# Patient Record
Sex: Male | Born: 1950 | Race: White | Hispanic: No | Marital: Married | Smoking: Never smoker
Health system: Southern US, Community
[De-identification: ages and names within clinical notes are randomized; demographics above are authoritative.]

## PROBLEM LIST (undated history)

## (undated) DIAGNOSIS — I10 Essential (primary) hypertension: Secondary | ICD-10-CM

## (undated) DIAGNOSIS — C801 Malignant (primary) neoplasm, unspecified: Secondary | ICD-10-CM

## (undated) DIAGNOSIS — K219 Gastro-esophageal reflux disease without esophagitis: Secondary | ICD-10-CM

## (undated) DIAGNOSIS — D649 Anemia, unspecified: Secondary | ICD-10-CM

## (undated) HISTORY — PX: APPENDECTOMY: SHX54

## (undated) HISTORY — PX: EYE SURGERY: SHX253

---

## 2017-08-28 ENCOUNTER — Other Ambulatory Visit (HOSPITAL_COMMUNITY): Payer: Self-pay | Admitting: Orthopedic Surgery

## 2017-08-28 DIAGNOSIS — C61 Malignant neoplasm of prostate: Secondary | ICD-10-CM

## 2017-08-28 DIAGNOSIS — R972 Elevated prostate specific antigen [PSA]: Secondary | ICD-10-CM

## 2017-08-30 ENCOUNTER — Other Ambulatory Visit (HOSPITAL_COMMUNITY): Payer: Self-pay | Admitting: Family Medicine

## 2017-08-30 DIAGNOSIS — M545 Low back pain, unspecified: Secondary | ICD-10-CM

## 2017-09-06 ENCOUNTER — Ambulatory Visit (HOSPITAL_COMMUNITY)
Admission: RE | Admit: 2017-09-06 | Discharge: 2017-09-06 | Disposition: A | Discharge status: Home / Self Care | Payer: BLUE CROSS/BLUE SHIELD | Source: Ambulatory Visit | Attending: Orthopedic Surgery | Admitting: Orthopedic Surgery

## 2017-09-06 ENCOUNTER — Ambulatory Visit (HOSPITAL_COMMUNITY)
Admission: RE | Admit: 2017-09-06 | Discharge: 2017-09-06 | Disposition: A | Discharge status: Home / Self Care | Payer: BLUE CROSS/BLUE SHIELD | Source: Ambulatory Visit | Attending: Family Medicine | Admitting: Family Medicine

## 2017-09-06 DIAGNOSIS — R590 Localized enlarged lymph nodes: Secondary | ICD-10-CM | POA: Diagnosis not present

## 2017-09-06 DIAGNOSIS — Z8546 Personal history of malignant neoplasm of prostate: Secondary | ICD-10-CM | POA: Insufficient documentation

## 2017-09-06 DIAGNOSIS — M545 Low back pain, unspecified: Secondary | ICD-10-CM

## 2017-09-06 DIAGNOSIS — R972 Elevated prostate specific antigen [PSA]: Secondary | ICD-10-CM | POA: Insufficient documentation

## 2017-09-06 DIAGNOSIS — C61 Malignant neoplasm of prostate: Secondary | ICD-10-CM

## 2017-09-06 LAB — POCT I-STAT CREATININE: CREATININE: 1 mg/dL (ref 0.61–1.24)

## 2017-09-06 MED ORDER — GADOBENATE DIMEGLUMINE 529 MG/ML IV SOLN
20.0000 mL | Freq: Once | INTRAVENOUS | Status: AC | PRN
  Administered 2017-09-06: 16 mL via INTRAVENOUS

## 2017-09-10 ENCOUNTER — Other Ambulatory Visit: Payer: Self-pay | Admitting: Urology

## 2017-09-10 DIAGNOSIS — R972 Elevated prostate specific antigen [PSA]: Secondary | ICD-10-CM

## 2017-09-10 DIAGNOSIS — C61 Malignant neoplasm of prostate: Secondary | ICD-10-CM

## 2017-09-19 ENCOUNTER — Ambulatory Visit
Admission: RE | Admit: 2017-09-19 | Discharge: 2017-09-19 | Disposition: A | Discharge status: Home / Self Care | Payer: BLUE CROSS/BLUE SHIELD | Source: Ambulatory Visit | Attending: Urology | Admitting: Urology

## 2017-09-19 DIAGNOSIS — C61 Malignant neoplasm of prostate: Secondary | ICD-10-CM | POA: Diagnosis not present

## 2017-09-19 DIAGNOSIS — R972 Elevated prostate specific antigen [PSA]: Secondary | ICD-10-CM

## 2017-09-19 MED ORDER — FLUDEOXYGLUCOSE F - 18 (FDG) INJECTION
10.0000 | Freq: Once | INTRAVENOUS | Status: AC | PRN
  Administered 2017-09-19: 11.48 via INTRAVENOUS

## 2018-06-26 ENCOUNTER — Other Ambulatory Visit: Payer: Self-pay | Admitting: Urology

## 2018-06-26 DIAGNOSIS — C61 Malignant neoplasm of prostate: Secondary | ICD-10-CM

## 2018-06-26 DIAGNOSIS — R972 Elevated prostate specific antigen [PSA]: Secondary | ICD-10-CM

## 2018-07-11 ENCOUNTER — Encounter
Admission: RE | Admit: 2018-07-11 | Discharge: 2018-07-11 | Disposition: A | Discharge status: Home / Self Care | Payer: Medicare Other | Source: Ambulatory Visit | Attending: Urology | Admitting: Urology

## 2018-07-11 ENCOUNTER — Other Ambulatory Visit: Payer: Self-pay

## 2018-07-11 DIAGNOSIS — C61 Malignant neoplasm of prostate: Secondary | ICD-10-CM | POA: Insufficient documentation

## 2018-07-11 DIAGNOSIS — R972 Elevated prostate specific antigen [PSA]: Secondary | ICD-10-CM | POA: Diagnosis present

## 2018-07-11 MED ORDER — AXUMIN (FLUCICLOVINE F 18) INJECTION
10.1200 | Freq: Once | INTRAVENOUS | Status: AC | PRN
  Administered 2018-07-11: 10.12 via INTRAVENOUS

## 2019-06-24 ENCOUNTER — Other Ambulatory Visit: Payer: Self-pay | Admitting: Urology

## 2019-06-24 DIAGNOSIS — R972 Elevated prostate specific antigen [PSA]: Secondary | ICD-10-CM

## 2019-06-24 DIAGNOSIS — C61 Malignant neoplasm of prostate: Secondary | ICD-10-CM

## 2019-07-02 ENCOUNTER — Other Ambulatory Visit: Payer: Self-pay

## 2019-07-02 ENCOUNTER — Ambulatory Visit
Admission: RE | Admit: 2019-07-02 | Discharge: 2019-07-02 | Disposition: A | Discharge status: Home / Self Care | Payer: Medicare Other | Source: Ambulatory Visit | Attending: Urology | Admitting: Urology

## 2019-07-02 ENCOUNTER — Other Ambulatory Visit: Payer: Medicare Other

## 2019-07-02 DIAGNOSIS — C61 Malignant neoplasm of prostate: Secondary | ICD-10-CM

## 2019-07-02 DIAGNOSIS — K760 Fatty (change of) liver, not elsewhere classified: Secondary | ICD-10-CM | POA: Insufficient documentation

## 2019-07-02 DIAGNOSIS — K8020 Calculus of gallbladder without cholecystitis without obstruction: Secondary | ICD-10-CM | POA: Insufficient documentation

## 2019-07-02 DIAGNOSIS — R9721 Rising PSA following treatment for malignant neoplasm of prostate: Secondary | ICD-10-CM | POA: Insufficient documentation

## 2019-07-02 DIAGNOSIS — K5730 Diverticulosis of large intestine without perforation or abscess without bleeding: Secondary | ICD-10-CM | POA: Diagnosis not present

## 2019-07-02 DIAGNOSIS — R972 Elevated prostate specific antigen [PSA]: Secondary | ICD-10-CM

## 2019-07-02 DIAGNOSIS — I700 Atherosclerosis of aorta: Secondary | ICD-10-CM | POA: Insufficient documentation

## 2019-07-02 MED ORDER — AXUMIN (FLUCICLOVINE F 18) INJECTION
10.9600 | Freq: Once | INTRAVENOUS | Status: AC | PRN
  Administered 2019-07-02: 10.96 via INTRAVENOUS

## 2019-08-23 IMAGING — CT CT L SPINE W/O CM
3 series · 11 of 33 positions shown, 13 images · non-contrast
Comparison: None.

CLINICAL DATA: Low back pain for 3 weeks.

EXAM:
CT LUMBAR SPINE WITHOUT CONTRAST
TECHNIQUE: Multidetector CT imaging of the lumbar spine was performed without
intravenous contrast administration. Multiplanar CT image
reconstructions were also generated.

[Series 5: l spine soft · axial · 0.31mm/px · z∈[+1266,+1452]mm · 3 of 152 slices shown, 4 images]
[im 35/152  soft-tissue]
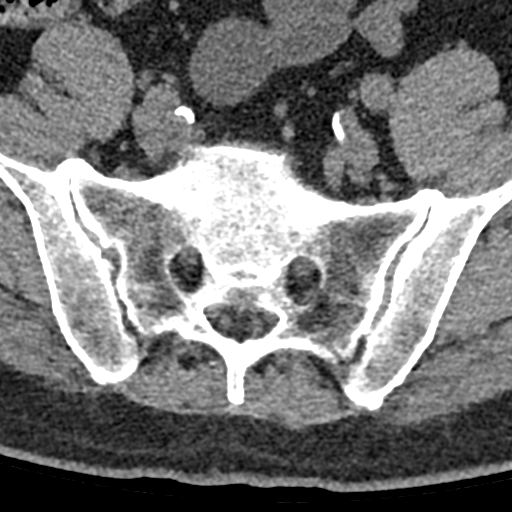
[im 35/152  bone]
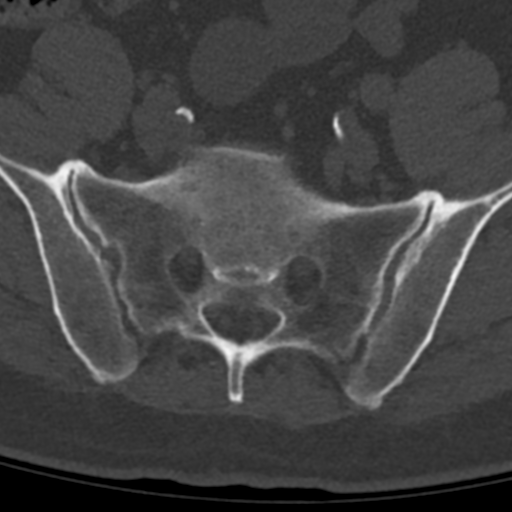
[im 82/152  bone]
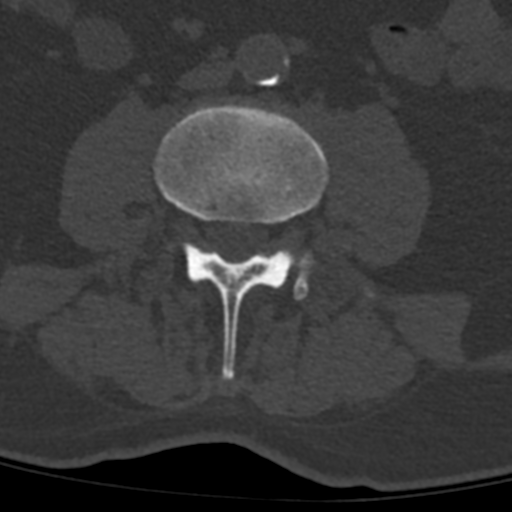
[im 128/152  bone]
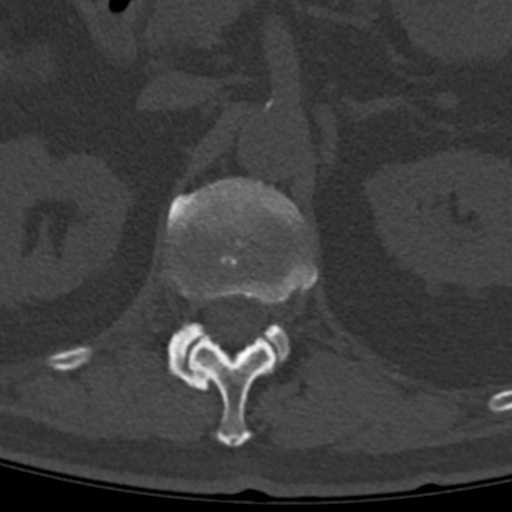

[Series 7: sagittal bone · sagittal · 0.31mm/px · 5 of 69 slices shown, 6 images]
[im 23/69  bone]
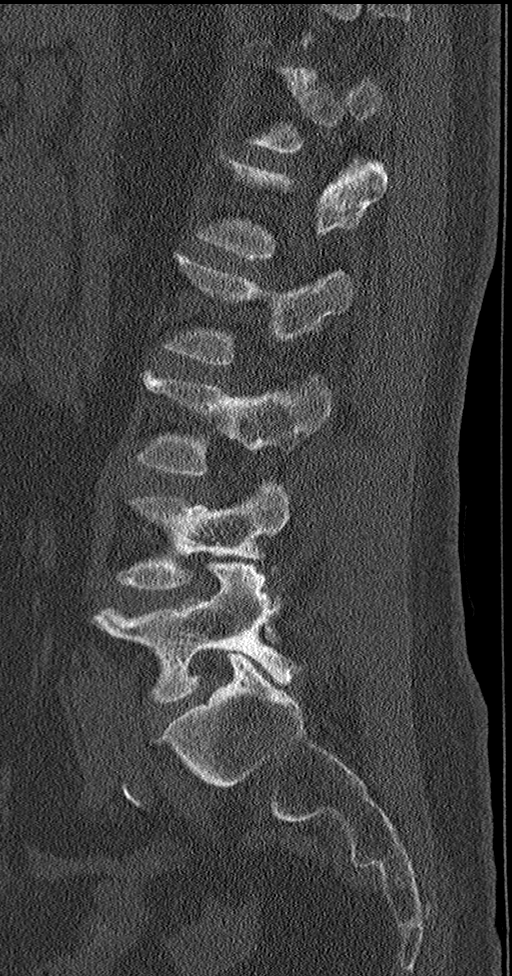
[im 29/69  bone]
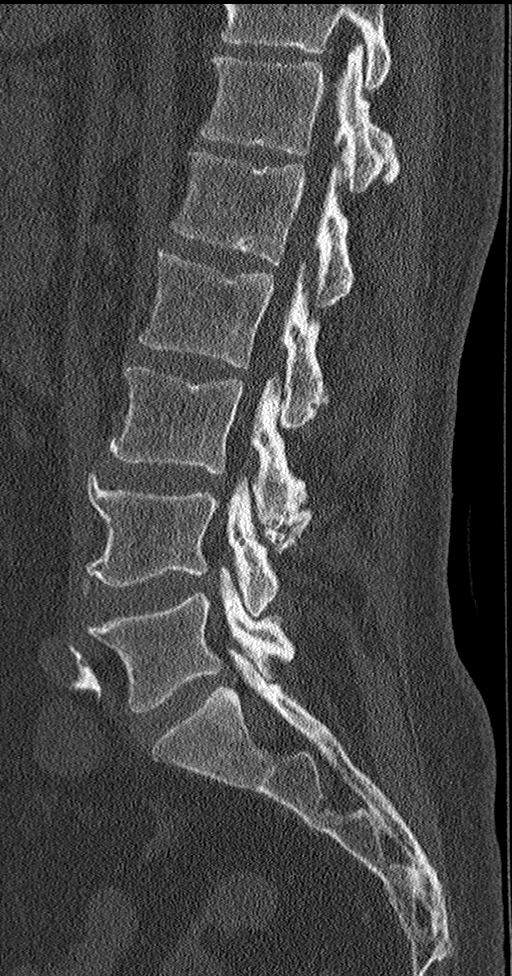
[im 35/69  soft-tissue]
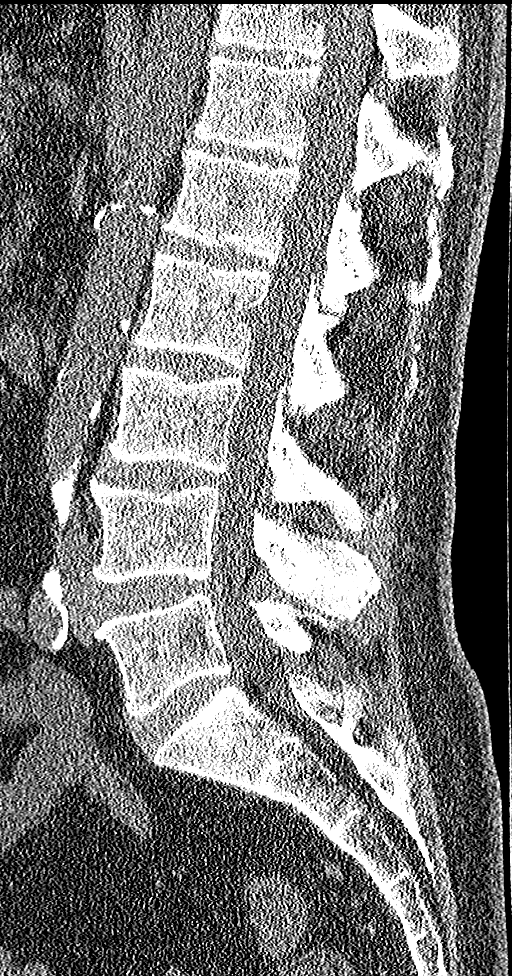
[im 35/69  bone]
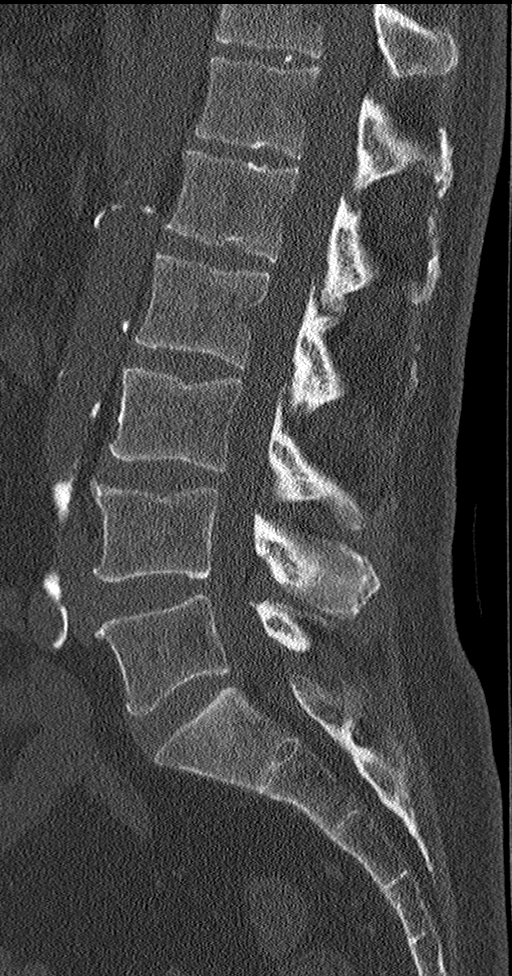
[im 40/69  bone]
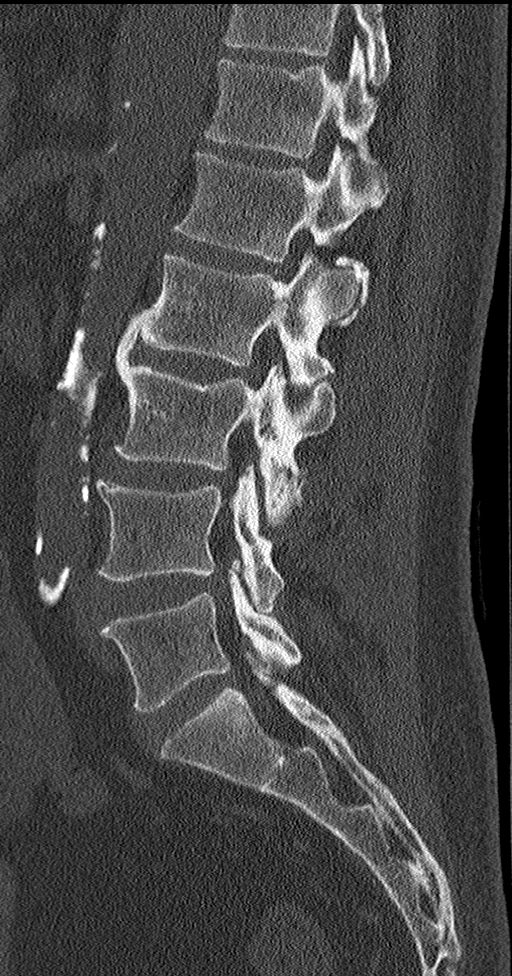
[im 46/69  bone]
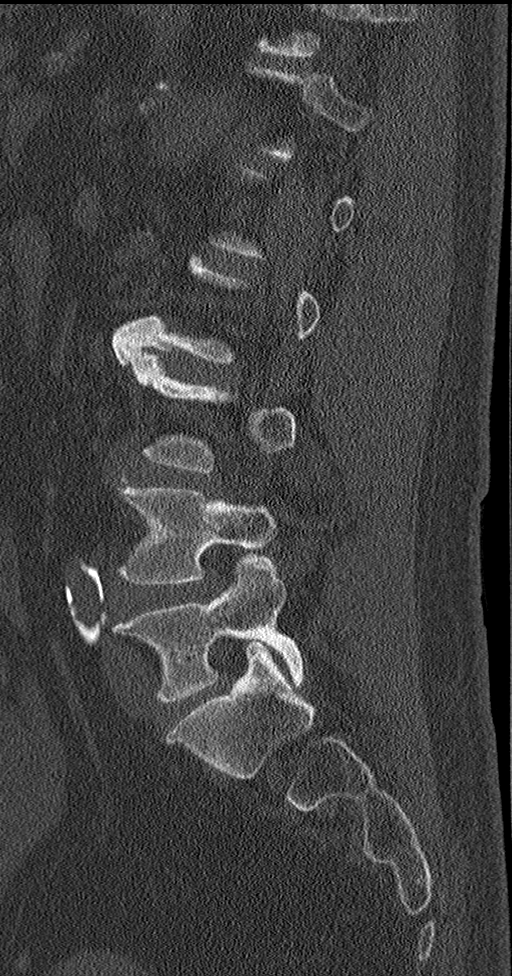

[Series 8: coronal bone · coronal · 0.44mm/px · 3 of 74 slices shown]
[im 15/74  bone]
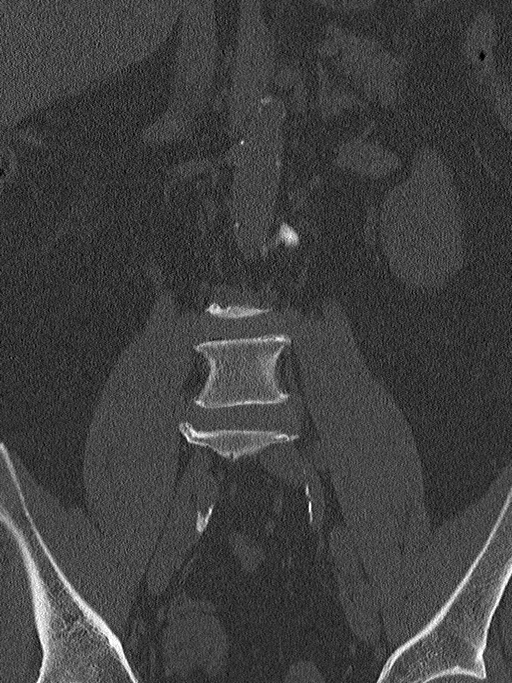
[im 30/74  bone]
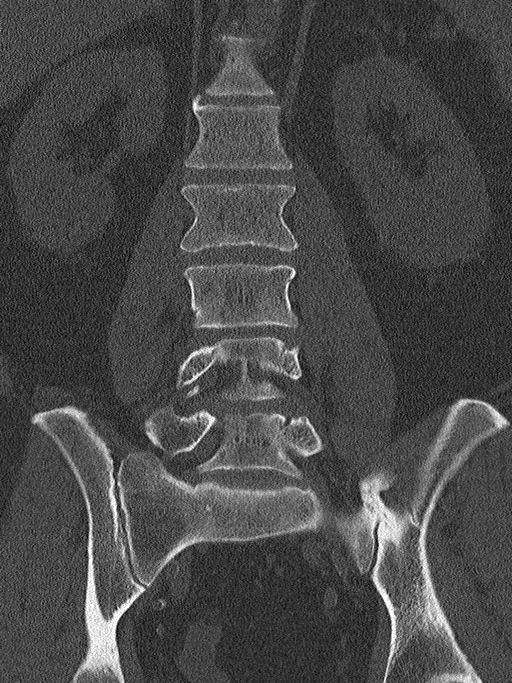
[im 44/74  bone]
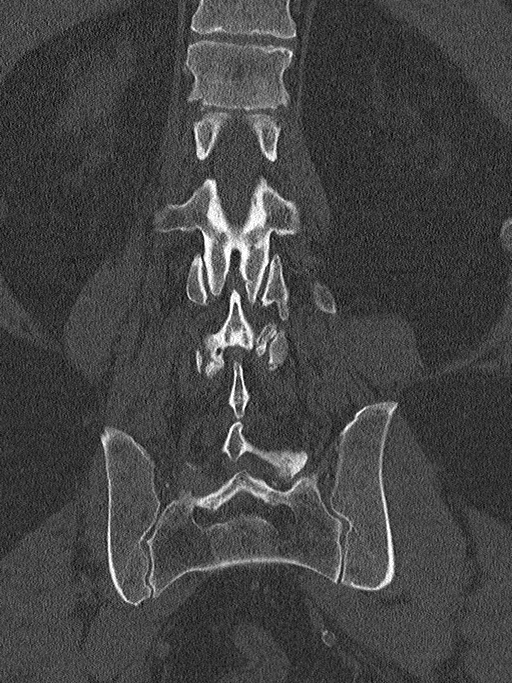

[11 of 33 positions shown; findings below may reference images not displayed]

FINDINGS: Segmentation: 5 lumbar type vertebrae.

Alignment: Normal.

Vertebrae: No acute fracture or focal pathologic process.

Paraspinal and other soft tissues: No acute paraspinal abnormality.

Disc levels: Disc spaces are maintained.

T12-L1: No significant disc protrusion. Moderate right and mild left
facet arthropathy.

L1-2: No disc protrusion. Mild bilateral facet arthropathy. No
foraminal stenosis.

L2-3: No disc protrusion. Moderate bilateral facet arthropathy. No
foraminal stenosis.

L3-4: Mild broad-based disc bulge. Moderate bilateral facet
arthropathy. Mild spinal stenosis. No foraminal stenosis.

L4-5: Broad-based disc bulge. Moderate bilateral facet arthropathy.
Moderate spinal stenosis. Mild bilateral foraminal stenosis.

L5-S1: Mild broad-based disc bulge. Moderate bilateral facet
arthropathy.
IMPRESSION: 1. No aggressive osseous lesion to suggest metastatic disease.
2. Mild lumbar spine spondylosis as described above.
3.  No acute osseous injury of the lumbar spine.

## 2021-01-10 ENCOUNTER — Other Ambulatory Visit: Payer: Self-pay | Admitting: Urology

## 2021-01-10 DIAGNOSIS — R972 Elevated prostate specific antigen [PSA]: Secondary | ICD-10-CM

## 2021-01-20 ENCOUNTER — Ambulatory Visit
Admission: RE | Admit: 2021-01-20 | Discharge: 2021-01-20 | Disposition: A | Discharge status: Home / Self Care | Payer: Medicare Other | Source: Ambulatory Visit | Attending: Urology | Admitting: Urology

## 2021-01-20 ENCOUNTER — Other Ambulatory Visit: Payer: Self-pay

## 2021-01-20 DIAGNOSIS — R972 Elevated prostate specific antigen [PSA]: Secondary | ICD-10-CM | POA: Diagnosis present

## 2021-01-20 MED ORDER — GADOBUTROL 1 MMOL/ML IV SOLN
7.5000 mL | Freq: Once | INTRAVENOUS | Status: AC | PRN
  Administered 2021-01-20: 7.5 mL via INTRAVENOUS

## 2021-03-02 ENCOUNTER — Other Ambulatory Visit: Payer: Self-pay

## 2021-03-02 ENCOUNTER — Other Ambulatory Visit
Admission: RE | Admit: 2021-03-02 | Discharge: 2021-03-02 | Disposition: A | Discharge status: Home / Self Care | Payer: Medicare Other | Source: Ambulatory Visit | Attending: Urology | Admitting: Urology

## 2021-03-02 VITALS — Ht 67.5 in | Wt 175.0 lb

## 2021-03-02 DIAGNOSIS — I10 Essential (primary) hypertension: Secondary | ICD-10-CM

## 2021-03-02 HISTORY — DX: Gastro-esophageal reflux disease without esophagitis: K21.9

## 2021-03-02 HISTORY — DX: Malignant (primary) neoplasm, unspecified: C80.1

## 2021-03-02 HISTORY — DX: Essential (primary) hypertension: I10

## 2021-03-02 HISTORY — DX: Anemia, unspecified: D64.9

## 2021-03-02 NOTE — Patient Instructions (Addendum)
?Your procedure is scheduled on: Thursday March 09, 2021. ?Report to Day Surgery inside [facility] 2nd floor, stop by admissions desk before getting on elevator. ?To find out your arrival time please call [phone] between 1PM - 3PM on Wednesday March 08, 2021. ? ?Remember: Instructions that are not followed completely may result in serious medical risk,  ?up to and including death, or upon the discretion of your surgeon and anesthesiologist your  ?surgery may need to be rescheduled.  ? ?  _X__ 1. Do not eat food after midnight the night before your procedure. ?                No chewing gum or hard candies. You may drink clear liquids up to 2 hours ?                before you are scheduled to arrive for your surgery- DO not drink clear ?                liquids within 2 hours of the start of your surgery. ?                Clear Liquids include:  water, apple juice without pulp, clear Gatorade, G2 or  ?                Gatorade Zero (avoid Red/Purple/Blue), Black Coffee or Tea (Do not add ?                anything to coffee or tea). ? ?__X__2.  On the morning of surgery brush your teeth with toothpaste and water, you ?               may rinse your mouth with mouthwash if you wish.  Do not swallow any toothpaste of mouthwash. ?   ? _X__ 3.  No Alcohol for 24 hours before or after surgery. ? ? _X__ 4.  Do Not Smoke or use e-cigarettes For 24 Hours Prior to Your Surgery. ?                Do not use any chewable tobacco products for at least 6 hours prior to ?                Surgery. ? ?_X__  5.  Do not use any recreational drugs (marijuana, cocaine, heroin, ecstasy, MDMA or other) ?               For at least one week prior to your surgery.  Combination of these drugs with anesthesia ?               May have life threatening results. ? ?____  6.  Bring all medications with you on the day of surgery if instructed.  ? ?__X__  7.  Notify your doctor if there is any change in your medical condition   ?    (cold, fever, infections). ?    ?Do not wear jewelry, make-up, hairpins, clips or nail polish. ?Do not wear lotions, powders, or perfumes. You may wear deodorant. ?Do not shave 48 hours prior to surgery. Men may shave face and neck. ?Do not bring valuables to the hospital.   ? ?[facility] is not responsible for any belongings or valuables. ? ?Contacts, dentures or bridgework may not be worn into surgery. ?Leave your suitcase in the car. After surgery it may be brought to your room. ?For patients admitted to the hospital, discharge time is determined  by your ?treatment team. ?  ?Patients discharged the day of surgery will not be allowed to drive home.   ?Make arrangements for someone to be with you for the first 24 hours of your ?Same Day Discharge. ? ? ?__X__ Take these medicines the morning of surgery with A SIP OF WATER:  ? ? 1. brivaracetam (BRIVIACT) 25 MG TABS ? 2. famotidine (PEPCID) 20 MG ? 3.  ? 4. ? 5. ? 6. ? ?__X__ Fleet Enema (as directed) repeat until clean   ? ?____ Use CHG Soap (or wipes) as directed ? ?____ Use Benzoyl Peroxide Gel as instructed ? ?____ Use inhalers on the day of surgery ? ?____ Stop metformin 2 days prior to surgery   ? ?____ Take 1/2 of usual insulin dose the night before surgery. No insulin the morning ?         of surgery.  ? ?____ Call your PCP, cardiologist, or Pulmonologist if taking Coumadin/Plavix/aspirin and ask when to stop before your surgery.  ? ?_X___ One Week prior to surgery- Stop Anti-inflammatories such as Ibuprofen, Aleve, Advil, Motrin, meloxicam (MOBIC), diclofenac, etodolac, ketorolac, Toradol, Daypro, piroxicam, Goody's or BC powders. OK TO USE TYLENOL IF NEEDED ?  ?__X__ Stop supplements until after surgery.   ? ?____ Bring C-Pap to the hospital.  ? ? ?If you have any questions regarding your pre-procedure instructions,  ?Please call Pre-admit Testing at [phone] ?

## 2021-03-03 NOTE — H&P (Signed)
NAME: Joe Bryant, Joe Bryant. ?MEDICAL RECORD NO: [id number] ?ACCOUNT NO: [id number] ?DATE OF BIRTH: 08-Dec-1950 ?FACILITY: [facility] ?LOCATION: [facility] ?PHYSICIAN: Otelia Limes. Yves Dill, MD ? ?History and Physical  ? ?DATE OF ADMISSION: 03/09/2021 ? ?Same day surgery 03/09/2021. ? ?CHIEF COMPLAINT:  Probable recurrent prostate cancer and elevated PSA. ? 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  PSA nadir in 2018 was 0.5 ng/mL.  Most ? recent PSA was 6.3 ng/mL on 01/06/2021.  He was evaluated with a prostate MRI scan on 01/20/2021 revealing a prostate volume of 8.6 mL with a suspicious lesion along the right side of the residual prostate gland and an additional suspicious lesion along ? the posterior aspect of the prostate gland.  These regions of interest were marked for UroNav fusion biopsy.  He comes in now for UroNav fusion biopsy. ? ?PAST MEDICAL HISTORY: ?ALLERGIES:  SULFA DRUGS. ? ?CURRENT MEDICATIONS:  Include Briviact, Hyzaar, Centrum Silver, folic acid, vitamin D3, Pepcid, vitamin C, Cialis and potassium. ? ?PAST SURGICAL HISTORY: ?1.  Appendectomy in 1971. ?2.  HIFU 2008 and 2013. ? ?PAST AND CURRENT MEDICAL CONDITIONS: ?1.  Hypertension. ?2.  Seizure disorder. ? ?REVIEW OF SYSTEMS:  The patient denies chest pain, shortness of breath, diabetes, stroke or heart disease. ? ?SOCIAL HISTORY:  The patient quit smoking 50 years ago with a 5-pack-year history.  He consumes five alcoholic beverages per week. ? ?FAMILY HISTORY:  Father died of a car accident in his 39s.  Mother died at age 36 of heart disease, but also had diabetes.  The patient has a brother, age 5 with prostate cancer, heart disease and diabetes. ? ?PHYSICAL EXAMINATION:   ?VITAL SIGNS:  Weight 180 pounds, height 5 feet 7 inches, BMI 28. ?GENERAL:  Well-nourished white male in no acute  distress. ?HEENT:  Sclerae were clear.  Pupils were equally round, reactive to light and accommodation.  Extraocular motions are intact. ?NECK:  No palpable cervical masses or tenderness. ?LUNGS:  Clear to auscultation. ?CARDIOVASCULAR:  Regular rhythm and rate without audible murmurs. ?ABDOMEN:  Soft and nontender abdomen. ?BACK:  No CVA tenderness. ?GENITOURINARY:  Deferred. ?RECTAL:  No palpable rectal masses and tenderness. ?NEUROMUSCULAR:  Alert and oriented x3. ? ?IMPRESSION:  ?1.  Probable recurrent prostate cancer. ?2.  Elevated PSA. ?3.  Abnormal prostate gland MRI scan. ? ?PLAN: UroNav fusion biopsy of the prostate. ? ? ?PUS ?D: 03/02/2021 4:00:30 pm T: 03/02/2021 5:31:00 pm  ?JOB: [id number]/ [id number]  ?

## 2021-03-08 ENCOUNTER — Other Ambulatory Visit: Payer: Self-pay

## 2021-03-08 ENCOUNTER — Encounter
Admission: RE | Admit: 2021-03-08 | Discharge: 2021-03-08 | Disposition: A | Discharge status: Home / Self Care | Payer: Medicare Other | Source: Ambulatory Visit | Attending: Urology | Admitting: Urology

## 2021-03-08 DIAGNOSIS — I10 Essential (primary) hypertension: Secondary | ICD-10-CM | POA: Diagnosis not present

## 2021-03-08 DIAGNOSIS — Z01818 Encounter for other preprocedural examination: Secondary | ICD-10-CM | POA: Diagnosis not present

## 2021-03-08 LAB — BASIC METABOLIC PANEL
Anion gap: 10 (ref 5–15)
BUN: 11 mg/dL (ref 8–23)
CO2: 24 mmol/L (ref 22–32)
Calcium: 9 mg/dL (ref 8.9–10.3)
Chloride: 98 mmol/L (ref 98–111)
Creatinine, Ser: 0.89 mg/dL (ref 0.61–1.24)
GFR, Estimated: >60 mL/min (ref >60)
Glucose, Bld: 107 mg/dL — ABNORMAL HIGH (ref 70–99)
Potassium: 3.4 mmol/L — ABNORMAL LOW (ref 3.5–5.1)
Sodium: 132 mmol/L — ABNORMAL LOW (ref 135–145)

## 2021-03-08 LAB — CBC
HCT: 35.8 % — ABNORMAL LOW (ref 39.0–52.0)
Hemoglobin: 12.7 g/dL — ABNORMAL LOW (ref 13.0–17.0)
MCH: 34.5 pg — ABNORMAL HIGH (ref 26.0–34.0)
MCHC: 35.5 g/dL (ref 30.0–36.0)
MCV: 97.3 fL (ref 80.0–100.0)
Platelets: 223 10*3/uL (ref 150–400)
RBC: 3.68 MIL/uL — ABNORMAL LOW (ref 4.22–5.81)
RDW: 12.3 % (ref 11.5–15.5)
WBC: 7.1 10*3/uL (ref 4.0–10.5)
nRBC: 0 % (ref 0.0–0.2)

## 2021-03-09 ENCOUNTER — Ambulatory Visit
Admission: RE | Admit: 2021-03-09 | Discharge: 2021-03-09 | Disposition: A | Discharge status: Home / Self Care | Payer: Medicare Other | Attending: Urology | Admitting: Urology

## 2021-03-09 ENCOUNTER — Encounter: Payer: Self-pay | Admitting: Urology

## 2021-03-09 ENCOUNTER — Encounter
Admission: RE | Disposition: A | Discharge status: Home / Self Care | Payer: Self-pay | Source: Home / Self Care | Attending: Urology

## 2021-03-09 ENCOUNTER — Ambulatory Visit: Payer: Medicare Other | Admitting: Urgent Care

## 2021-03-09 ENCOUNTER — Other Ambulatory Visit: Payer: Self-pay

## 2021-03-09 DIAGNOSIS — K219 Gastro-esophageal reflux disease without esophagitis: Secondary | ICD-10-CM | POA: Insufficient documentation

## 2021-03-09 DIAGNOSIS — C61 Malignant neoplasm of prostate: Secondary | ICD-10-CM | POA: Diagnosis not present

## 2021-03-09 DIAGNOSIS — I252 Old myocardial infarction: Secondary | ICD-10-CM | POA: Insufficient documentation

## 2021-03-09 DIAGNOSIS — I10 Essential (primary) hypertension: Secondary | ICD-10-CM | POA: Insufficient documentation

## 2021-03-09 DIAGNOSIS — Z79899 Other long term (current) drug therapy: Secondary | ICD-10-CM | POA: Diagnosis not present

## 2021-03-09 HISTORY — PX: PROSTATE BIOPSY: SHX241

## 2021-03-09 SURGERY — BIOPSY, PROSTATE
Anesthesia: General

## 2021-03-09 MED ORDER — PHENYLEPHRINE HCL-NACL 20-0.9 MG/250ML-% IV SOLN
INTRAVENOUS | Status: AC
  Filled 2021-03-09: qty 250

## 2021-03-09 MED ORDER — FENTANYL CITRATE (PF) 100 MCG/2ML IJ SOLN
INTRAMUSCULAR | Status: AC
  Filled 2021-03-09: qty 2

## 2021-03-09 MED ORDER — PROPOFOL 500 MG/50ML IV EMUL
INTRAVENOUS | Status: AC
  Filled 2021-03-09: qty 50

## 2021-03-09 MED ORDER — ONDANSETRON HCL 4 MG/2ML IJ SOLN: 4.0000 mg | Freq: Once | INTRAMUSCULAR | Status: DC | PRN

## 2021-03-09 MED ORDER — PHENYLEPHRINE HCL (PRESSORS) 10 MG/ML IV SOLN
INTRAVENOUS | Status: DC | PRN
  Administered 2021-03-09: 200 ug via INTRAVENOUS

## 2021-03-09 MED ORDER — SUCCINYLCHOLINE CHLORIDE 200 MG/10ML IV SOSY
PREFILLED_SYRINGE | INTRAVENOUS | Status: DC | PRN
Start: 2021-03-09 — End: 2021-03-09
  Administered 2021-03-09: 100 mg via INTRAVENOUS

## 2021-03-09 MED ORDER — DOCUSATE SODIUM 100 MG PO CAPS: 200.0000 mg | ORAL_CAPSULE | Freq: Two times a day (BID) | ORAL | 3 refills | Status: AC

## 2021-03-09 MED ORDER — PROPOFOL 10 MG/ML IV BOLUS
INTRAVENOUS | Status: DC | PRN
  Administered 2021-03-09: 200 mg via INTRAVENOUS

## 2021-03-09 MED ORDER — CHLORHEXIDINE GLUCONATE 0.12 % MT SOLN
OROMUCOSAL | Status: AC
Start: 2021-03-09 — End: 2021-03-09
  Administered 2021-03-09: 07:00:00 15 mL via OROMUCOSAL
  Filled 2021-03-09: qty 15

## 2021-03-09 MED ORDER — CEFAZOLIN SODIUM-DEXTROSE 1-4 GM/50ML-% IV SOLN
1.0000 g | Freq: Once | INTRAVENOUS | Status: AC
  Administered 2021-03-09: 08:00:00 2 g via INTRAVENOUS

## 2021-03-09 MED ORDER — CEFAZOLIN SODIUM-DEXTROSE 1-4 GM/50ML-% IV SOLN
INTRAVENOUS | Status: AC
  Filled 2021-03-09: qty 50

## 2021-03-09 MED ORDER — SUGAMMADEX SODIUM 200 MG/2ML IV SOLN
INTRAVENOUS | Status: DC | PRN
  Administered 2021-03-09: 200 mg via INTRAVENOUS

## 2021-03-09 MED ORDER — DEXAMETHASONE SODIUM PHOSPHATE 10 MG/ML IJ SOLN
INTRAMUSCULAR | Status: DC | PRN
  Administered 2021-03-09: 10 mg via INTRAVENOUS

## 2021-03-09 MED ORDER — ROCURONIUM BROMIDE 100 MG/10ML IV SOLN
INTRAVENOUS | Status: DC | PRN
  Administered 2021-03-09: 20 mg via INTRAVENOUS

## 2021-03-09 MED ORDER — LACTATED RINGERS IV SOLN: INTRAVENOUS | Status: DC | PRN

## 2021-03-09 MED ORDER — ORAL CARE MOUTH RINSE: 15.0000 mL | Freq: Once | OROMUCOSAL | Status: AC

## 2021-03-09 MED ORDER — FENTANYL CITRATE (PF) 100 MCG/2ML IJ SOLN: 25.0000 ug | INTRAMUSCULAR | Status: DC | PRN

## 2021-03-09 MED ORDER — FENTANYL CITRATE (PF) 100 MCG/2ML IJ SOLN
INTRAMUSCULAR | Status: DC | PRN
  Administered 2021-03-09: 25 ug via INTRAVENOUS

## 2021-03-09 MED ORDER — SEVOFLURANE IN SOLN
RESPIRATORY_TRACT | Status: AC
  Filled 2021-03-09: qty 250

## 2021-03-09 MED ORDER — GENTAMICIN IN SALINE 1.6-0.9 MG/ML-% IV SOLN
80.0000 mg | Freq: Once | INTRAVENOUS | Status: AC
  Administered 2021-03-09: 08:00:00 80 mg via INTRAVENOUS
  Filled 2021-03-09: qty 50

## 2021-03-09 MED ORDER — LIDOCAINE HCL (CARDIAC) PF 100 MG/5ML IV SOSY
PREFILLED_SYRINGE | INTRAVENOUS | Status: DC | PRN
  Administered 2021-03-09: 100 mg via INTRAVENOUS

## 2021-03-09 MED ORDER — ONDANSETRON HCL 4 MG/2ML IJ SOLN
INTRAMUSCULAR | Status: DC | PRN
  Administered 2021-03-09: 4 mg via INTRAVENOUS

## 2021-03-09 MED ORDER — LACTATED RINGERS IV SOLN: INTRAVENOUS | Status: DC

## 2021-03-09 MED ORDER — CHLORHEXIDINE GLUCONATE 0.12 % MT SOLN: 15.0000 mL | Freq: Once | OROMUCOSAL | Status: AC

## 2021-03-09 MED ORDER — EPHEDRINE SULFATE (PRESSORS) 50 MG/ML IJ SOLN
INTRAMUSCULAR | Status: DC | PRN
  Administered 2021-03-09: 10 mg via INTRAVENOUS

## 2021-03-09 MED ORDER — LEVOFLOXACIN 500 MG PO TABS: 500.0000 mg | ORAL_TABLET | Freq: Every day | ORAL | 1 refills | Status: AC

## 2021-03-09 SURGICAL SUPPLY — 18 items
COVER MAYO STAND REUSABLE (DRAPES) ×2 IMPLANT
COVER TRANSDUCER UNLTRASOUND (MISCELLANEOUS) ×1 IMPLANT
FEE DELIVERY LASER CO2 FORTEC (MISCELLANEOUS) IMPLANT
GLOVE SURG ENC MOIS LTX SZ7 (GLOVE) ×4 IMPLANT
GUIDE NDL URONAV ULTRASND S (MISCELLANEOUS) IMPLANT
GUIDE NEEDLE URONAV ULTRASND S (MISCELLANEOUS) ×1 IMPLANT
INST BIOPSY MAXCORE 18GX25 (NEEDLE) ×2 IMPLANT
LASER CO2 FORTEC DELIVERY FEE (MISCELLANEOUS) ×2 IMPLANT
MANIFOLD NEPTUNE II (INSTRUMENTS) ×1 IMPLANT
PROBE URONAV BK 8808E 8818 HLD (MISCELLANEOUS) IMPLANT
STRAP SAFETY 5IN WIDE (MISCELLANEOUS) ×2 IMPLANT
SURGILUBE 2OZ TUBE FLIPTOP (MISCELLANEOUS) ×2 IMPLANT
TOWEL OR 17X26 4PK STRL BLUE (TOWEL DISPOSABLE) ×2 IMPLANT
URONAV BK 8808E 8818 PROBE HLD (MISCELLANEOUS) ×2
URONAV MRI FUSION TWO PATIENTS (MISCELLANEOUS) ×1 IMPLANT
URONAV ULTRASOUND (MISCELLANEOUS) ×1 IMPLANT
URONAV ULTRASOUND NDL GUIDE S (MISCELLANEOUS) ×2
WATER STERILE IRR 500ML POUR (IV SOLUTION) ×2 IMPLANT

## 2021-03-09 NOTE — Transfer of Care (Signed)
Immediate Anesthesia Transfer of Care Note ? ?Patient: Joe Bryant ? ?Procedure(s) Performed: PROSTATE BIOPSY URONAV ? ?Patient Location: PACU ? ?Anesthesia Type:General ? ?Level of Consciousness: drowsy ? ?Airway & Oxygen Therapy: Patient Spontanous Breathing and Patient connected to face mask oxygen ? ?Post-op Assessment: Report given to RN and Post -op Vital signs reviewed and stable ? ?Post vital signs: Reviewed and stable ? ?Last Vitals:  ?Vitals Value Taken Time  ?BP    ?Temp    ?Pulse    ?Resp    ?SpO2    ? ? ?Last Pain:  ?Vitals:  ? 03/09/21 0638  ?TempSrc: Oral  ?PainSc: 0-No pain  ?   ? ?  ? ?Complications: No notable events documented. ?

## 2021-03-09 NOTE — Anesthesia Postprocedure Evaluation (Signed)
Anesthesia Post Note ? ?Patient: Joe Bryant ? ?Procedure(s) Performed: PROSTATE BIOPSY URONAV ? ?Patient location during evaluation: PACU ?Anesthesia Type: General ?Level of consciousness: awake and awake and alert ?Pain management: pain level controlled ?Vital Signs Assessment: post-procedure vital signs reviewed and stable ?Respiratory status: respiratory function stable ?Anesthetic complications: no ? ? ?No notable events documented. ? ? ?Last Vitals:  ?Vitals:  ? 03/09/21 0845 03/09/21 0855  ?BP:  131/80  ?Pulse: 72 67  ?Resp: 17 18  ?Temp: (!) 36.2 ?C 36.4 ?C  ?SpO2: 96% 100%  ?  ?Last Pain:  ?Vitals:  ? 03/09/21 0855  ?TempSrc: Temporal  ?PainSc: 0-No pain  ? ? ?  ?  ?  ?  ?  ?  ? ?VAN STAVEREN,[name] ? ? ? ? ?

## 2021-03-09 NOTE — Op Note (Signed)
Preoperative diagnosis: 1.  Elevated PSA (R97.2) ?                                          2.  Prostate cancer (C61) ?                                          3.  Abnormal prostate gland MRI scan (D40.0) ? ?Postoperative diagnosis: Same ? ?Procedure: Transrectal Uronav fusion biopsy of the prostate (CPT [phone], [id number]) ? ?Surgeon: Otelia Limes. Yves Dill MD ? ?Anesthesia: General ? ?Indications:See the history and physical. After informed consent the above procedure(s) were requested ? ?   ?Technique and findings: After adequate general anesthesia been obtained the patient placed in left lateral decubitus position.  DRE was performed and rectal vault noted to be clear of fecal material.  The ultrasound probe was then placed and images acquired.  The true sound images were then fused with the MRI images and region of interest #1 identified.  3 core biopsies were taken from this region.  Region of interest #2 was identified and 3 core biopsies taken here.  At this point the ultrasound probe was removed.  Blood loss was minimal.  The procedure was then terminated and patient transferred to the recovery room in stable condition.  ? ?  ? ?  ?

## 2021-03-09 NOTE — Anesthesia Preprocedure Evaluation (Addendum)
Anesthesia Evaluation  ?Patient identified by MRN, date of birth, ID band ?Patient awake ? ? ? ?Reviewed: ?Allergy & Precautions, NPO status , Patient's Chart, lab work & pertinent test results ? ?Airway ?Mallampati: III ? ?TM Distance: >3 FB ?Neck ROM: full ? ? ? Dental ? ?(+) Teeth Intact ?  ?Pulmonary ?neg pulmonary ROS,  ?  ?Pulmonary exam normal ?breath sounds clear to auscultation ? ? ? ? ? ? Cardiovascular ?hypertension, Pt. on medications ?+ Past MI  ?negative cardio ROS ?Normal cardiovascular exam ?Rhythm:Regular  ? ?  ?Neuro/Psych ?negative neurological ROS ? negative psych ROS  ? GI/Hepatic ?negative GI ROS, Neg liver ROS, GERD  ,  ?Endo/Other  ?negative endocrine ROS ? Renal/GU ?negative Renal ROS  ? ?  ?Musculoskeletal ? ? Abdominal ?Normal abdominal exam  (+)   ?Peds ?negative pediatric ROS ?(+)  Hematology ?negative hematology ROS ?(+) Blood dyscrasia, anemia ,   ?Anesthesia Other Findings ?Past Medical History: ?No date: Anemia ?No date: Cancer [facility]) ?No date: GERD (gastroesophageal reflux disease) ?No date: Hypertension ? ?Past Surgical History: ?No date: APPENDECTOMY ?    Comment:  while in highschool ?No date: EYE SURGERY ?    Comment:  lasiks surgery and Torn Retina on Right eye ? ?BMI   ? Body Mass Index: 27.01 kg/m?  ?  ? ? Reproductive/Obstetrics ?negative OB ROS ? ?  ? ? ? ? ? ? ? ? ? ? ? ? ? ?  ?  ? ? ? ? ? ? ? ?Anesthesia Physical ?Anesthesia Plan ? ?ASA: 3 ? ?Anesthesia Plan: General  ? ?Post-op Pain Management:   ? ?Induction: Intravenous ? ?PONV Risk Score and Plan: Ondansetron, Dexamethasone, Midazolam and Treatment may vary due to age or medical condition ? ?Airway Management Planned: Oral ETT ? ?Additional Equipment:  ? ?Intra-op Plan:  ? ?Post-operative Plan: Extubation in OR ? ?Informed Consent: I have reviewed the patients History and Physical, chart, labs and discussed the procedure including the risks, benefits and alternatives for the proposed  anesthesia with the patient or authorized representative who has indicated his/her understanding and acceptance.  ? ? ? ?Dental Advisory Given ? ?Plan Discussed with: CRNA and Surgeon ? ?Anesthesia Plan Comments:   ? ? ? ? ? ? ?Anesthesia Quick Evaluation ? ?

## 2021-03-09 NOTE — H&P (Signed)
Date of Initial H&P: 03/02/21 ? ?History reviewed, patient examined, no change in status, stable for surgery. ?

## 2021-03-09 NOTE — Anesthesia Procedure Notes (Signed)
Procedure Name: Intubation ?Date/Time: 03/09/2021 7:36 AM ?Performed by: Beverely Low, CRNA ?Pre-anesthesia Checklist: Patient identified, Patient being monitored, Timeout performed, Emergency Drugs available and Suction available ?Patient Re-evaluated:Patient Re-evaluated prior to induction ?Oxygen Delivery Method: Circle system utilized ?Preoxygenation: Pre-oxygenation with 100% oxygen ?Induction Type: IV induction ?Ventilation: Mask ventilation without difficulty ?Laryngoscope Size: McGraph and 4 ?Grade View: Grade I ?Tube type: Oral ?Tube size: 7.5 mm ?Number of attempts: 1 ?Airway Equipment and Method: Stylet ?Placement Confirmation: ETT inserted through vocal cords under direct vision, positive ETCO2 and breath sounds checked- equal and bilateral ?Secured at: 21 cm ?Tube secured with: Tape ?Dental Injury: Teeth and Oropharynx as per pre-operative assessment  ? ? ? ? ?

## 2021-03-09 NOTE — Discharge Instructions (Addendum)
Transrectal Ultrasound-Guided Prostate Biopsy, Care After ?What can I expect after the procedure? ?After the procedure, it is common to have: ?Pain and discomfort near your butt (rectum), especially while sitting. ?Pink-colored pee (urine). This is due to small amounts of blood in your pee. ?A burning feeling while peeing. ?Blood in your poop (stool). ?Bleeding from your butt. ?Blood in your semen. ?Follow these instructions at home: ?Medicines ?Take over-the-counter and prescription medicines only as told by your doctor. ?If you were given a sedative during your procedure, do not drive or use machines until your doctor says that it is safe. A sedative is a medicine that helps you relax. ?If you were prescribed an antibiotic medicine, take it as told by your doctor. Do not stop taking it even if you start to feel better. ?Activity ?A sign showing that a person should not lift anything heavy. ? ?Return to your normal activities when your doctor says that it is safe. ?Ask your doctor when it is okay for you to have sex. ?You may have to avoid lifting. Ask your doctor how much you can safely lift. ?General instructions ?A comparison of three sample cups showing dark yellow, yellow, and pale yellow urine. ? ?Drink enough water to keep your pee pale yellow. ?Watch your pee, poop, and semen for new bleeding or bleeding that gets worse. ?Keep all follow-up visits. ?Contact a doctor if: ?You have any of these: ?Blood clots in your pee or poop. ?Blood in your pee more than 2 weeks after the procedure. ?Blood in your semen more than 2 months after the procedure. ?New or worse bleeding in your pee, poop, or semen. ?Very bad belly pain. ?Your pee smells bad or unusual. ?You have trouble peeing. ?Your lower belly feels firm. ?You have problems getting an erection. ?You feel like you may vomit (are nauseous), or you vomit. ?Get help right away if: ?You have a fever or chills. ?You have bright red pee. ?You have very bad pain that  does not get better with medicine. ?You cannot pee. ?Summary ?After this procedure, it is common to have pain and discomfort near your butt, especially while sitting. ?You may have blood in your pee and poop. ?It is common to have blood in your semen. ?Get help right away if you have a fever or chills. ?This information is not intended to replace advice given to you by your health care provider. Make sure you discuss any questions you have with your health care provider. ?Document Revised: 06/13/2020 Document Reviewed: 06/13/2020 ?Elsevier Patient Education ? [place]. ?   ?AMBULATORY SURGERY  ?DISCHARGE INSTRUCTIONS ? ? ?The drugs that you were given will stay in your system until tomorrow so for the next 24 hours you should not: ? ?Drive an automobile ?Make any legal decisions ?Drink any alcoholic beverage ? ? ?You may resume regular meals tomorrow.  Today it is better to start with liquids and gradually work up to solid foods. ? ?You may eat anything you prefer, but it is better to start with liquids, then soup and crackers, and gradually work up to solid foods. ? ? ?Please notify your doctor immediately if you have any unusual bleeding, trouble breathing, redness and pain at the surgery site, drainage, fever, or pain not relieved by medication. ? ? ? ?Additional Instructions: ? ? ? ? ? ? ? ?Please contact your physician with any problems or Same Day Surgery at [phone], Monday through Friday 6 am to 4 pm, or [place]  at [facility] number at [phone].  ?

## 2021-03-10 LAB — SURGICAL PATHOLOGY
